# Patient Record
Sex: Male | Born: 1985 | Race: Black or African American | Hispanic: No | Marital: Single | Smoking: Never smoker
Health system: Southern US, Community
[De-identification: ages and names within clinical notes are randomized; demographics above are authoritative.]

---

## 2016-02-14 ENCOUNTER — Emergency Department (HOSPITAL_COMMUNITY): Payer: Self-pay

## 2016-02-14 ENCOUNTER — Emergency Department (HOSPITAL_COMMUNITY)
Admission: EM | Admit: 2016-02-14 | Discharge: 2016-02-14 | Disposition: A | Discharge status: Home / Self Care | Payer: Self-pay | Attending: Emergency Medicine | Admitting: Emergency Medicine

## 2016-02-14 ENCOUNTER — Encounter (HOSPITAL_COMMUNITY): Payer: Self-pay | Admitting: Emergency Medicine

## 2016-02-14 DIAGNOSIS — Y939 Activity, unspecified: Secondary | ICD-10-CM | POA: Insufficient documentation

## 2016-02-14 DIAGNOSIS — S62606B Fracture of unspecified phalanx of right little finger, initial encounter for open fracture: Secondary | ICD-10-CM

## 2016-02-14 DIAGNOSIS — S62636B Displaced fracture of distal phalanx of right little finger, initial encounter for open fracture: Secondary | ICD-10-CM | POA: Insufficient documentation

## 2016-02-14 DIAGNOSIS — Y999 Unspecified external cause status: Secondary | ICD-10-CM | POA: Insufficient documentation

## 2016-02-14 DIAGNOSIS — W231XXA Caught, crushed, jammed, or pinched between stationary objects, initial encounter: Secondary | ICD-10-CM | POA: Insufficient documentation

## 2016-02-14 DIAGNOSIS — Y929 Unspecified place or not applicable: Secondary | ICD-10-CM | POA: Insufficient documentation

## 2016-02-14 MED ORDER — CEPHALEXIN 500 MG PO CAPS: 500.0000 mg | ORAL_CAPSULE | Freq: Four times a day (QID) | ORAL | Status: AC

## 2016-02-14 MED ORDER — HYDROCODONE-ACETAMINOPHEN 5-325 MG PO TABS: 1.0000 | ORAL_TABLET | Freq: Four times a day (QID) | ORAL | Status: AC | PRN

## 2016-02-14 NOTE — ED Notes (Signed)
Patient got his 5th finger on R hand caught when closing a cabinet.   Laceration to top of finger.   Patient denies other problems.

## 2016-02-14 NOTE — ED Notes (Signed)
Finger cleaned and soaked in normal saline with betadine solution

## 2016-02-14 NOTE — ED Notes (Signed)
Patient refused finger splint, states "I won't use the splint".  Rob PortlandBrowning PA notified and patient's finger dressed and wrapped without splint. No complaints or needs voiced.

## 2016-02-14 NOTE — ED Provider Notes (Signed)
CSN: [id number]     Arrival date & time 02/14/16  1322 History  By signing my name below, I, Doreatha Martinva Mathews, attest that this documentation has been prepared under the direction and in the presence of Roxy Horsemanobert Kayin Kettering, PA-C. Electronically Signed: Doreatha MartinEva Mathews, ED Scribe. 02/14/2016. 2:26 PM.    Chief Complaint  Patient presents with  . Finger Injury   The history is provided by the patient. No language interpreter was used.   HPI Comments: Juan Leonard is a 30 y.o. male who presents to the Emergency Department complaining of a laceration with controlled bleeding to the right 5th finger s/p crush injury that occurred one hour ago. Pt states he was moving a cabinet when his finger was caught in a drawer that closed suddenly, causing his injury. Pt states he is currently pain-free. Bleeding controlled with pressure applied PTA. No worsening or alleviating factors noted. Tdap UTD. He denies numbness, weakness, additional injuries.    History reviewed. No pertinent past medical history. History reviewed. No pertinent past surgical history. No family history on file. Social History  Substance Use Topics  . Smoking status: Never Smoker   . Smokeless tobacco: None  . Alcohol Use: No    Review of Systems  Musculoskeletal: Negative for arthralgias.  Skin: Positive for wound.  Neurological: Negative for weakness and numbness.   Allergies  Review of patient's allergies indicates no known allergies.  Home Medications   Prior to Admission medications   Not on File   BP 120/68 mmHg  Pulse 67  Temp(Src) 98.1 F (36.7 C) (Oral)  Resp 16  SpO2 100% Physical Exam Physical Exam  Constitutional: Pt appears well-developed and well-nourished. No distress.  HENT:  Head: Normocephalic and atraumatic.  Eyes: Conjunctivae are normal.  Neck: Normal range of motion.  Cardiovascular: Normal rate, regular rhythm and intact distal pulses.   Capillary refill < 3 sec  Pulmonary/Chest: Effort normal and  breath sounds normal.  Musculoskeletal: Pt exhibits no tenderness. Pt exhibits no edema. 1 cm laceration over the posterior DIP joint of the right 5th digit.  ROM: 5/5 isolated at the DIP  Neurological: Pt  is alert. Coordination normal.  Sensation intact Strength intact  Skin: Skin is warm and dry. Pt is not diaphoretic.  No tenting of the skin  Psychiatric: Pt has a normal mood and affect.  Nursing note and vitals reviewed.   ED Course  Procedures (including critical care time) DIAGNOSTIC STUDIES: Oxygen Saturation is 100% on RA, normal by my interpretation.    COORDINATION OF CARE: 2:20 PM Discussed treatment plan with pt at bedside which includes XR, laceration repair, finger splint and pt agreed to plan.   Imaging Review Dg Finger Little Right  02/14/2016  CLINICAL DATA:  Slammed finger in door EXAM: RIGHT FIFTH FINGER 2+V COMPARISON:  None. FINDINGS: Frontal, oblique, and lateral views obtained. There is an incomplete fracture along the proximal most aspect of the lateral portion of the first distal phalanx with alignment anatomic. There is soft tissue swelling in this area. No other fracture. No dislocation. Joint spaces appear normal. No erosive change. IMPRESSION: Incomplete obliquely oriented fracture along the proximal most aspect of the lateral portion of the first distal phalanx with alignment anatomic. No other fracture. No dislocation. No appreciable arthropathy. Electronically Signed   By: Bretta BangWilliam  Woodruff III M.D.   On: 02/14/2016 14:21   I have personally reviewed and evaluated these images as part of my medical decision-making.  3:35 PM Consulted with hand surgeon,  Dr. Merlyn LotKuzma, who recommends f/u in office.    MDM   Final diagnoses:  Fracture of phalanx of right little finger, open, initial encounter    Patient with fracture of distal right little finger distal phalanx, with overlying laceration, doubt open fracture, though this is not ruled out. I discussed patient  with Dr. Merlyn LotKuzma, who recommends splinting, closure, and antibiotics. Recommend follow-up in his office in the next week. Patient understands and agrees the plan.  I personally performed the services described in this documentation, which was scribed in my presence. The recorded information has been reviewed and is accurate.     Roxy Horsemanobert Giulia Hickey, PA-C 02/14/16 1613  Marily MemosJason Mesner, MD 02/14/16 [phone]

## 2016-02-14 NOTE — Discharge Instructions (Signed)
Finger Fracture  Fractures of fingers are breaks in the bones of the fingers. There are many types of fractures. There are different ways of treating these fractures. Your health care provider will discuss the best way to treat your fracture.  CAUSES  Traumatic injury is the main cause of broken fingers. These include:  · Injuries while playing sports.  · Workplace injuries.  · Falls.  RISK FACTORS  Activities that can increase your risk of finger fractures include:  · Sports.  · Workplace activities that involve machinery.  · A condition called osteoporosis, which can make your bones less dense and cause them to fracture more easily.  SIGNS AND SYMPTOMS  The main symptoms of a broken finger are pain and swelling within 15 minutes after the injury. Other symptoms include:  · Bruising of your finger.  · Stiffness of your finger.  · Numbness of your finger.  · Exposed bones (compound fracture) if the fracture is severe.  DIAGNOSIS   The best way to diagnose a broken bone is with X-ray imaging. Additionally, your health care provider will use this X-ray image to evaluate the position of the broken finger bones.   TREATMENT   Finger fractures can be treated with:   · Nonreduction--This means the bones are in place. The finger is splinted without changing the positions of the bone pieces. The splint is usually left on for about a week to 10 days. This will depend on your fracture and what your health care provider thinks.  · Closed reduction--The bones are put back into position without using surgery. The finger is then splinted.  · Open reduction and internal fixation--The fracture site is opened. Then the bone pieces are fixed into place with pins or some type of hardware. This is seldom required. It depends on the severity of the fracture.  HOME CARE INSTRUCTIONS   · Follow your health care provider's instructions regarding activities, exercises, and physical therapy.  · Only take over-the-counter or prescription  medicines for pain, discomfort, or fever as directed by your health care provider.  SEEK MEDICAL CARE IF:  You have pain or swelling that limits the motion or use of your fingers.  SEEK IMMEDIATE MEDICAL CARE IF:   Your finger becomes numb.  MAKE SURE YOU:   · Understand these instructions.  · Will watch your condition.  · Will get help right away if you are not doing well or get worse.     This information is not intended to replace advice given to you by your health care provider. Make sure you discuss any questions you have with your health care provider.     Document Released: 11/16/2000 Document Revised: 05/25/2013 Document Reviewed: 03/16/2013  Elsevier Interactive Patient Education ©2016 [employer]

## 2017-09-27 ENCOUNTER — Encounter (HOSPITAL_COMMUNITY): Payer: Self-pay | Admitting: Emergency Medicine

## 2017-09-27 ENCOUNTER — Emergency Department (HOSPITAL_COMMUNITY)
Admission: EM | Admit: 2017-09-27 | Discharge: 2017-09-27 | Disposition: A | Discharge status: Home / Self Care | Payer: 59 | Attending: Emergency Medicine | Admitting: Emergency Medicine

## 2017-09-27 DIAGNOSIS — G510 Bell's palsy: Secondary | ICD-10-CM | POA: Insufficient documentation

## 2017-09-27 DIAGNOSIS — R29810 Facial weakness: Secondary | ICD-10-CM | POA: Diagnosis present

## 2017-09-27 MED ORDER — PREDNISONE 20 MG PO TABS
60.0000 mg | ORAL_TABLET | Freq: Once | ORAL | Status: AC
  Administered 2017-09-27: 60 mg via ORAL
  Filled 2017-09-27: qty 3

## 2017-09-27 MED ORDER — VALACYCLOVIR HCL 500 MG PO TABS
1000.0000 mg | ORAL_TABLET | Freq: Once | ORAL | Status: AC
  Administered 2017-09-27: 1000 mg via ORAL
  Filled 2017-09-27: qty 2

## 2017-09-27 MED ORDER — VALACYCLOVIR HCL 1 G PO TABS: 1000.0000 mg | ORAL_TABLET | Freq: Three times a day (TID) | ORAL | 0 refills | Status: AC

## 2017-09-27 MED ORDER — PREDNISONE 10 MG (21) PO TBPK: ORAL_TABLET | Freq: Every day | ORAL | 0 refills | Status: AC

## 2017-09-27 NOTE — Discharge Instructions (Signed)
Take prednisone and Valtrex as prescribed.  Use moisturizing eyedrops to keep your eye from drying out.  Tape your eyelid shut when going to bed.  Please follow-up with family doctor if not improving.  Please return to emergency department if worsening symptoms

## 2017-09-27 NOTE — ED Notes (Signed)
Declined W/C at D/C and was escorted to lobby by RN. 

## 2017-09-27 NOTE — ED Triage Notes (Signed)
Pt to ER with complaint of left facial droop, inability to blink eye lid, and facial numbness. No other neuoro deficits. Onset 2 days ago.

## 2017-09-27 NOTE — ED Provider Notes (Signed)
[facility] EMERGENCY DEPARTMENT Provider Note   CSN: [id number] Arrival date & time: 09/27/17  1741     History   Chief Complaint Chief Complaint  Patient presents with  . Facial Droop    HPI Juan Leonard is a 32 y.o. male.  HPI Juan Leonard is a 32 y.o. male presents to emergency department complaining of left facial droop.  Patient states his symptoms began 2 days ago.  He states since the onset his symptoms have gotten worse.  He reports initially left mouth droop and when he spoke, which progressed now to him not being able to close his left eye or raise his left eyebrow.  He reports numbness sensation to the left side of the face.  He reports numbness to the left side of the tongue.  He reports mild headache.  He denies any head injuries.  Denies any numbness or weakness to extremities.  Denies any trouble with speech.  No difficulty ambulating.  No history of the same.  Denies any recent illnesses.  He states nothing is making symptoms better or worse.  History reviewed. No pertinent past medical history.  There are no active problems to display for this patient.   History reviewed. No pertinent surgical history.     Home Medications    Prior to Admission medications   Medication Sig Start Date End Date Taking? Authorizing Provider  cephALEXin (KEFLEX) 500 MG capsule Take 1 capsule (500 mg total) by mouth 4 (four) times daily. 02/14/16   Roxy Horseman, PA-C  HYDROcodone-acetaminophen (NORCO/VICODIN) 5-325 MG tablet Take 1 tablet by mouth every 6 (six) hours as needed. 02/14/16   Roxy Horseman, PA-C    Family History No family history on file.  Social History Social History   Tobacco Use  . Smoking status: Never Smoker  . Smokeless tobacco: Never Used  Substance Use Topics  . Alcohol use: No  . Drug use: No     Allergies   Patient has no known allergies.   Review of Systems Review of Systems  Constitutional: Negative for chills and  fever.  HENT: Negative for congestion.   Respiratory: Negative for cough, chest tightness and shortness of breath.   Cardiovascular: Negative for chest pain, palpitations and leg swelling.  Gastrointestinal: Negative for abdominal distention, abdominal pain, diarrhea, nausea and vomiting.  Genitourinary: Negative for dysuria, frequency, hematuria and urgency.  Musculoskeletal: Negative for arthralgias, myalgias, neck pain and neck stiffness.  Skin: Negative for rash.  Allergic/Immunologic: Negative for immunocompromised state.  Neurological: Positive for weakness, numbness and headaches. Negative for dizziness and light-headedness.  All other systems reviewed and are negative.    Physical Exam Updated Vital Signs BP 124/62 (BP Location: Right Arm)   Pulse 83   Temp 98.5 F (36.9 C) (Oral)   Resp 18   SpO2 100%   Physical Exam  Constitutional: He is oriented to person, place, and time. He appears well-developed and well-nourished. No distress.  HENT:  Head: Normocephalic and atraumatic.  Eyes: Conjunctivae are normal.  Neck: Neck supple.  Cardiovascular: Normal rate, regular rhythm and normal heart sounds.  Pulmonary/Chest: Effort normal. No respiratory distress. He has no wheezes. He has no rales.  Abdominal: Soft. Bowel sounds are normal. He exhibits no distension. There is no tenderness. There is no rebound.  Musculoskeletal: He exhibits no edema.  Neurological: He is alert and oriented to person, place, and time. He displays normal reflexes. No cranial nerve deficit. Coordination normal.  Left facial droop, patient  unable to close his left eye completely, unable to raise left eyebrow.  Unable to wrinkle left  forehead on the left side.  Full range of motion of the tongue.  Decreased sensation to the left forehead, maxilla, mandible.  The rest of the cranial nerves are intact.  5 out of 5 and equal bilateral upper and lower extremity.  Skin: Skin is warm and dry.  Nursing note  and vitals reviewed.    ED Treatments / Results  Labs (all labs ordered are listed, but only abnormal results are displayed) Labs Reviewed - No data to display  EKG  EKG Interpretation None       Radiology No results found.  Procedures Procedures (including critical care time)  Medications Ordered in ED Medications  valACYclovir (VALTREX) tablet 1,000 mg (not administered)  predniSONE (DELTASONE) tablet 60 mg (not administered)     Initial Impression / Assessment and Plan / ED Course  I have reviewed the triage vital signs and the nursing notes.  Pertinent labs & imaging results that were available during my care of the patient were reviewed by me and considered in my medical decision making (see chart for details).     Patient in emergency department with left facial droop onset 2 days ago.  His exam is most consistent with Bell's palsy, and involved all branches of facial nerve.  Patient unable to close his eye, unable to raise his eyebrow.  He has no risk factors for CVA.  He has no other neurological findings.  I do not think he needs any imaging on emergent basis at this time.  I will start him on Valtrex and prednisone.  We will have him follow-up with family doctor.  Patient agrees to the plan.  Vitals:   09/27/17 1752  BP: 124/62  Pulse: 83  Resp: 18  Temp: 98.5 F (36.9 C)  TempSrc: Oral  SpO2: 100%     Final Clinical Impressions(s) / ED Diagnoses   Final diagnoses:  Bell's palsy    ED Discharge Orders        Ordered    predniSONE (STERAPRED UNI-PAK 21 TAB) 10 MG (21) TBPK tablet  Daily     09/27/17 1826    valACYclovir (VALTREX) 1000 MG tablet  3 times daily     09/27/17 1826       Jaynie CrumbleKirichenko, Mulki Roesler, PA-C 09/27/17 1827    Phillis HaggisMabe, Martha L, MD 09/27/17 [phone]

## 2018-02-26 IMAGING — CR DG FINGER LITTLE 2+V*R*
3 series · 3 of 3 positions shown · non-contrast
Comparison: None.

CLINICAL DATA: Slammed finger in door

EXAM:
RIGHT FIFTH FINGER 2+V

[finger ap]
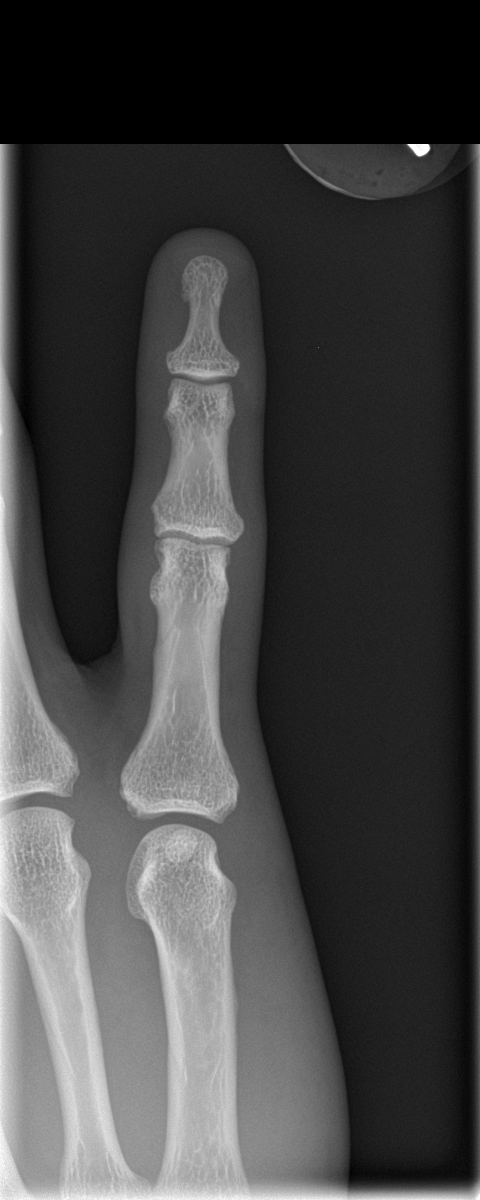

[finger obl]
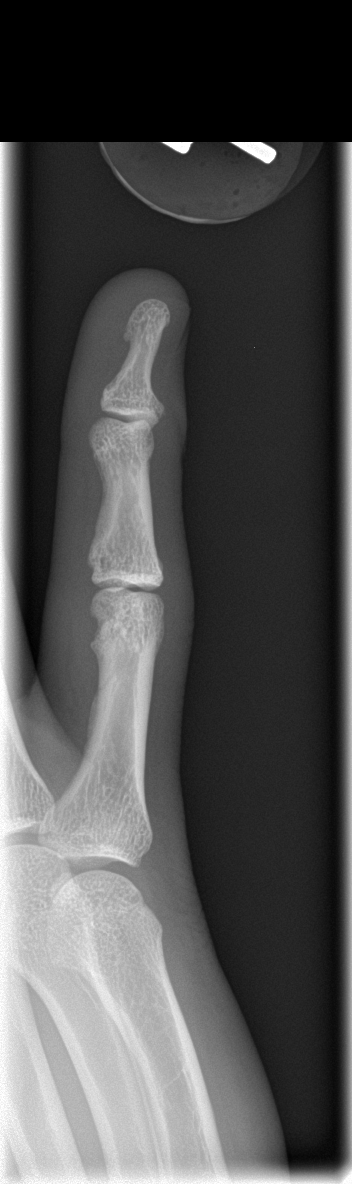

[finger lat]
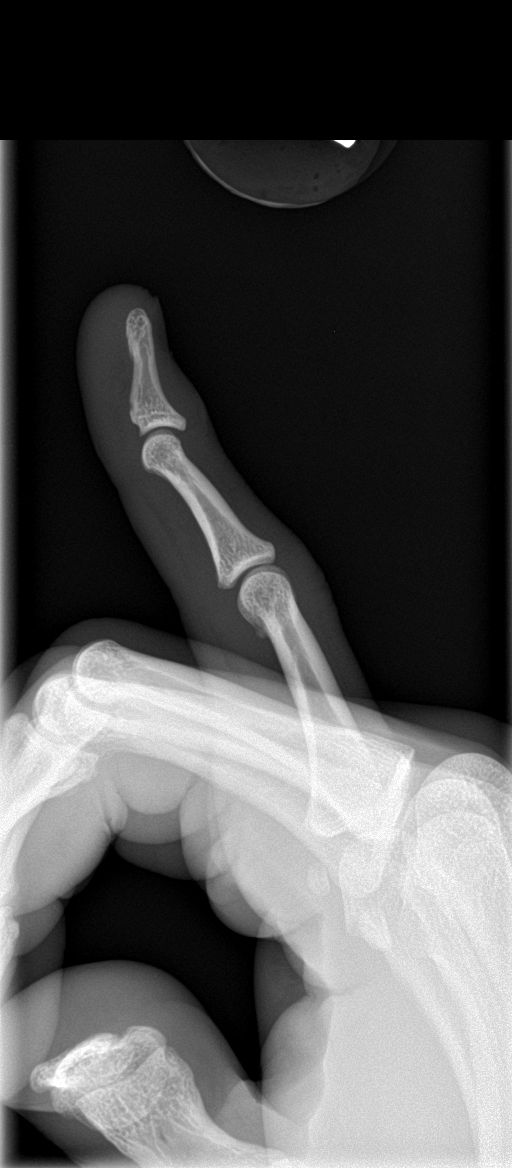

[3 of 3 positions shown; findings below may reference images not displayed]

FINDINGS: Frontal, oblique, and lateral views obtained. There is an incomplete
fracture along the proximal most aspect of the lateral portion of
the first distal phalanx with alignment anatomic. There is soft
tissue swelling in this area. No other fracture. No dislocation.
Joint spaces appear normal. No erosive change.
IMPRESSION: Incomplete obliquely oriented fracture along the proximal most
aspect of the lateral portion of the first distal phalanx with
alignment anatomic. No other fracture. No dislocation. No
appreciable arthropathy.
